# Patient Record
Sex: Female | Born: 1960 | Race: Black or African American | Hispanic: No | Smoking: Never smoker
Health system: Southern US, Community
[De-identification: ages and names within clinical notes are randomized; demographics above are authoritative.]

## PROBLEM LIST (undated history)

## (undated) DIAGNOSIS — M8580 Other specified disorders of bone density and structure, unspecified site: Secondary | ICD-10-CM

## (undated) DIAGNOSIS — K219 Gastro-esophageal reflux disease without esophagitis: Secondary | ICD-10-CM

## (undated) DIAGNOSIS — R251 Tremor, unspecified: Secondary | ICD-10-CM

## (undated) HISTORY — DX: Gastro-esophageal reflux disease without esophagitis: K21.9

## (undated) HISTORY — DX: Tremor, unspecified: R25.1

## (undated) HISTORY — PX: NO PAST SURGERIES: SHX2092

## (undated) HISTORY — DX: Other specified disorders of bone density and structure, unspecified site: M85.80

---

## 2016-04-22 DIAGNOSIS — Z713 Dietary counseling and surveillance: Secondary | ICD-10-CM | POA: Diagnosis not present

## 2016-06-30 ENCOUNTER — Other Ambulatory Visit: Payer: Self-pay | Admitting: Family Medicine

## 2016-06-30 ENCOUNTER — Ambulatory Visit
Admission: RE | Admit: 2016-06-30 | Discharge: 2016-06-30 | Disposition: A | Discharge status: Home / Self Care | Payer: BLUE CROSS/BLUE SHIELD | Source: Ambulatory Visit | Attending: Family Medicine | Admitting: Family Medicine

## 2016-06-30 DIAGNOSIS — M79604 Pain in right leg: Secondary | ICD-10-CM | POA: Diagnosis not present

## 2016-06-30 DIAGNOSIS — M79661 Pain in right lower leg: Secondary | ICD-10-CM | POA: Diagnosis not present

## 2016-06-30 DIAGNOSIS — Z23 Encounter for immunization: Secondary | ICD-10-CM | POA: Diagnosis not present

## 2016-07-16 DIAGNOSIS — R634 Abnormal weight loss: Secondary | ICD-10-CM | POA: Diagnosis not present

## 2016-07-16 DIAGNOSIS — K219 Gastro-esophageal reflux disease without esophagitis: Secondary | ICD-10-CM | POA: Diagnosis not present

## 2016-07-16 DIAGNOSIS — R251 Tremor, unspecified: Secondary | ICD-10-CM | POA: Diagnosis not present

## 2016-07-27 DIAGNOSIS — M6281 Muscle weakness (generalized): Secondary | ICD-10-CM | POA: Diagnosis not present

## 2016-07-27 DIAGNOSIS — R262 Difficulty in walking, not elsewhere classified: Secondary | ICD-10-CM | POA: Diagnosis not present

## 2016-07-27 DIAGNOSIS — M79604 Pain in right leg: Secondary | ICD-10-CM | POA: Diagnosis not present

## 2016-07-27 DIAGNOSIS — R293 Abnormal posture: Secondary | ICD-10-CM | POA: Diagnosis not present

## 2016-08-04 DIAGNOSIS — R251 Tremor, unspecified: Secondary | ICD-10-CM | POA: Diagnosis not present

## 2016-08-04 DIAGNOSIS — R0602 Shortness of breath: Secondary | ICD-10-CM | POA: Diagnosis not present

## 2016-08-04 DIAGNOSIS — K219 Gastro-esophageal reflux disease without esophagitis: Secondary | ICD-10-CM | POA: Diagnosis not present

## 2016-08-11 DIAGNOSIS — Z713 Dietary counseling and surveillance: Secondary | ICD-10-CM | POA: Diagnosis not present

## 2016-09-22 DIAGNOSIS — K219 Gastro-esophageal reflux disease without esophagitis: Secondary | ICD-10-CM | POA: Diagnosis not present

## 2016-09-22 DIAGNOSIS — R634 Abnormal weight loss: Secondary | ICD-10-CM | POA: Diagnosis not present

## 2016-09-23 DIAGNOSIS — R634 Abnormal weight loss: Secondary | ICD-10-CM | POA: Diagnosis not present

## 2016-09-23 DIAGNOSIS — K219 Gastro-esophageal reflux disease without esophagitis: Secondary | ICD-10-CM | POA: Diagnosis not present

## 2016-09-23 DIAGNOSIS — M8580 Other specified disorders of bone density and structure, unspecified site: Secondary | ICD-10-CM | POA: Diagnosis not present

## 2016-09-23 DIAGNOSIS — K3189 Other diseases of stomach and duodenum: Secondary | ICD-10-CM | POA: Diagnosis not present

## 2016-09-23 DIAGNOSIS — Z79899 Other long term (current) drug therapy: Secondary | ICD-10-CM | POA: Diagnosis not present

## 2016-09-23 DIAGNOSIS — R251 Tremor, unspecified: Secondary | ICD-10-CM | POA: Diagnosis not present

## 2016-09-23 DIAGNOSIS — K2970 Gastritis, unspecified, without bleeding: Secondary | ICD-10-CM | POA: Diagnosis not present

## 2016-09-23 DIAGNOSIS — K2900 Acute gastritis without bleeding: Secondary | ICD-10-CM | POA: Diagnosis not present

## 2016-10-02 ENCOUNTER — Ambulatory Visit: Payer: BLUE CROSS/BLUE SHIELD | Admitting: Neurology

## 2016-10-06 DIAGNOSIS — F411 Generalized anxiety disorder: Secondary | ICD-10-CM | POA: Diagnosis not present

## 2016-10-06 DIAGNOSIS — K219 Gastro-esophageal reflux disease without esophagitis: Secondary | ICD-10-CM | POA: Diagnosis not present

## 2016-10-06 DIAGNOSIS — R634 Abnormal weight loss: Secondary | ICD-10-CM | POA: Diagnosis not present

## 2016-10-14 ENCOUNTER — Encounter: Payer: Self-pay | Admitting: Neurology

## 2016-10-16 NOTE — Progress Notes (Signed)
Subjective:   Taylor Chapman was seen in consultation in the movement disorder clinic at the request of Sissy Hoff, MD.  The evaluation is for tremor.  I have reviewed her primary care records dated 07/16/2016.  The patient presented to her primary care physician that today with complaints of generalized shaking.  She stated that she had a large bowel movement one morning, which was followed by decreased oral intake.  Ever since that morning, she had several mornings of waking up feeling tense and shaky.  Her primary care physician prescribed her low-dose Ativan as needed and felt that anxiety could be a component.  She states that the ativan is helping.  She was taking it nightly but hasn't had it last 3 nights.  Tremor is worse in the AM but has been better as of late.  It is better as day goes on. When and if she has the tremor (better lately), it involves the hands bilaterally.  Tremor is most noticeable when hands are in use.   There is no family hx of tremor.   She had lab work tested that day.  Her TSH was normal at 0.84.    Affected by caffeine:  Doesn't know (doesn't drink caffeine) Affected by alcohol:  Doesn't know (doesn't drink EtOH) Affected by stress:  Yes.   Affected by fatigue:  perhaps Spills soup if on spoon:  No. Spills glass of liquid if full:  No. Affects ADL's (tying shoes, brushing teeth, etc):  No.  Current/Previously tried tremor medications: ativan  Current medications that may exacerbate tremor:  n/a  Other c/o is "jerking" as she is falling asleep or when she is asleep and will wake her out of sleep.  Outside reports reviewed: lab reports and office notes.  No Known Allergies  Outpatient Encounter Prescriptions as of 10/20/2016  Medication Sig  . LORazepam (ATIVAN) 0.5 MG tablet Take 0.5 mg by mouth every 8 (eight) hours.  . Multiple Vitamin (MULTIVITAMIN) tablet Take 1 tablet by mouth daily.  Marland Kitchen omeprazole (PRILOSEC) 20 MG capsule Take 20 mg by mouth  daily.   No facility-administered encounter medications on file as of 10/20/2016.     Past Medical History:  Diagnosis Date  . GERD (gastroesophageal reflux disease)   . Osteopenia   . Tremor     Past Surgical History:  Procedure Laterality Date  . NO PAST SURGERIES      Social History   Social History  . Marital status: Unknown    Spouse name: N/A  . Number of children: N/A  . Years of education: N/A   Occupational History  . Not on file.   Social History Main Topics  . Smoking status: Never Smoker  . Smokeless tobacco: Never Used  . Alcohol use No  . Drug use: No  . Sexual activity: Not on file   Other Topics Concern  . Not on file   Social History Narrative  . No narrative on file    Family Status  Relation Status  . Mother Alive  . Father Deceased  . Sister Alive  . Brother Alive    Review of Systems A complete 10 system ROS was obtained and was negative apart from what is mentioned.   Objective:   VITALS:   Vitals:   10/20/16 0836  BP: 92/60  Pulse: 74  SpO2: 99%  Weight: 127 lb (57.6 kg)  Height:  (1.702 m)   Gen:  Appears stated age and in NAD. HEENT:  Normocephalic, atraumatic. The mucous membranes are moist. The superficial temporal arteries are without ropiness or tenderness. Cardiovascular: Regular rate and rhythm. Lungs: Clear to auscultation bilaterally. Neck: There are no carotid bruits noted bilaterally.  NEUROLOGICAL:  Orientation:  The patient is alert and oriented x 3.  Recent and remote memory are intact.  Attention span and concentration are normal.  Able to name objects and repeat without trouble.  Fund of knowledge is appropriate Cranial nerves: There is good facial symmetry. The pupils are equal round and reactive to light bilaterally. Fundoscopic exam reveals clear disc margins bilaterally. Extraocular muscles are intact and visual fields are full to confrontational testing. Speech is fluent and clear. Soft palate  rises symmetrically and there is no tongue deviation. Hearing is intact to conversational tone. Tone: Tone is good throughout. Sensation: Sensation is intact to light touch and pinprick throughout (facial, trunk, extremities). Vibration is intact at the bilateral big toe. There is no extinction with double simultaneous stimulation. There is no sensory dermatomal level identified. Coordination:  The patient has no dysdiadichokinesia or dysmetria. Motor: Strength is 5/5 in the bilateral upper and lower extremities.  Shoulder shrug is equal bilaterally.  There is no pronator drift.  There are no fasciculations noted. DTR's: Deep tendon reflexes are 2/4 at the bilateral biceps, triceps, brachioradialis, patella and achilles.  Plantar responses are downgoing bilaterally. Gait and Station: The patient is able to ambulate without difficulty. The patient is able to heel toe walk without any difficulty. The patient is able to ambulate in a tandem fashion. The patient is able to stand in the Romberg position.   MOVEMENT EXAM: Tremor:  There is no tremor of the outstretched hands.  No tremor with intention.  There was no tremor when she was given a weight, either in the proximal or distal position.  There was no tremor when she was asked to pour water from one glass to another.  She has no tremor with Archimedes spirals.  She is able to write her name without any difficulty.  Labs: I work on 12/14/2016.  As above, TSH was 0.84.  Sodium was 142, potassium 4.1, chloride 107, CO2 25, BUN 10, creatinine 0.71, glucose was 116.  AST was 17, ALT 13.  White blood cells were 5.5, hemoglobin 12.6, hematocrit 37.6 and platelets were 231.  Her B12 was greater than 1500 and it was recommended by her primary care doctor that she decrease her supplemental intake.     Assessment/Plan:   1.  Tremor, by history  -I really did not see any tremor today.  She reports that she has some tremor when she awakens in the morning and  then it goes away throughout the day.  Sometimes this can be seen in patients who are hypoglycemic and I would recommend that, if indicated, this be further explored with her PCP.  She and I talked about enhanced physiologic tremor and anxiety-induced tremor, both of which are benign phenomenons and do not require any further treatment.  2.  Benign hypnic myoclonus  -explained to her the benign nature of this.  She was more worried about this than she was about tremor.    3.  f/u prn  CC:  Sissy Hoff, MD

## 2016-10-20 ENCOUNTER — Ambulatory Visit (INDEPENDENT_AMBULATORY_CARE_PROVIDER_SITE_OTHER): Payer: BLUE CROSS/BLUE SHIELD | Admitting: Neurology

## 2016-10-20 ENCOUNTER — Encounter: Payer: Self-pay | Admitting: Neurology

## 2016-10-20 VITALS — BP 92/60 | HR 74 | Ht 67.0 in | Wt 127.0 lb

## 2016-10-20 DIAGNOSIS — R251 Tremor, unspecified: Secondary | ICD-10-CM

## 2016-10-20 DIAGNOSIS — G253 Myoclonus: Secondary | ICD-10-CM | POA: Diagnosis not present

## 2016-11-03 DIAGNOSIS — K219 Gastro-esophageal reflux disease without esophagitis: Secondary | ICD-10-CM | POA: Diagnosis not present

## 2016-11-03 DIAGNOSIS — R1013 Epigastric pain: Secondary | ICD-10-CM | POA: Diagnosis not present

## 2016-11-03 DIAGNOSIS — R634 Abnormal weight loss: Secondary | ICD-10-CM | POA: Diagnosis not present

## 2016-12-14 DIAGNOSIS — Z1211 Encounter for screening for malignant neoplasm of colon: Secondary | ICD-10-CM | POA: Diagnosis not present

## 2016-12-14 DIAGNOSIS — K219 Gastro-esophageal reflux disease without esophagitis: Secondary | ICD-10-CM | POA: Diagnosis not present

## 2016-12-14 DIAGNOSIS — F411 Generalized anxiety disorder: Secondary | ICD-10-CM | POA: Diagnosis not present

## 2016-12-14 DIAGNOSIS — R7309 Other abnormal glucose: Secondary | ICD-10-CM | POA: Diagnosis not present

## 2016-12-14 DIAGNOSIS — M8588 Other specified disorders of bone density and structure, other site: Secondary | ICD-10-CM | POA: Diagnosis not present

## 2016-12-14 DIAGNOSIS — Z13220 Encounter for screening for lipoid disorders: Secondary | ICD-10-CM | POA: Diagnosis not present

## 2016-12-14 DIAGNOSIS — Z0000 Encounter for general adult medical examination without abnormal findings: Secondary | ICD-10-CM | POA: Diagnosis not present

## 2016-12-14 DIAGNOSIS — J309 Allergic rhinitis, unspecified: Secondary | ICD-10-CM | POA: Diagnosis not present

## 2017-07-16 DIAGNOSIS — Z713 Dietary counseling and surveillance: Secondary | ICD-10-CM | POA: Diagnosis not present

## 2017-08-05 DIAGNOSIS — J069 Acute upper respiratory infection, unspecified: Secondary | ICD-10-CM | POA: Diagnosis not present

## 2017-09-03 DIAGNOSIS — Z713 Dietary counseling and surveillance: Secondary | ICD-10-CM | POA: Diagnosis not present

## 2017-12-20 DIAGNOSIS — Z1211 Encounter for screening for malignant neoplasm of colon: Secondary | ICD-10-CM | POA: Diagnosis not present

## 2017-12-20 DIAGNOSIS — Z0000 Encounter for general adult medical examination without abnormal findings: Secondary | ICD-10-CM | POA: Diagnosis not present

## 2017-12-20 DIAGNOSIS — E559 Vitamin D deficiency, unspecified: Secondary | ICD-10-CM | POA: Diagnosis not present

## 2018-02-01 DIAGNOSIS — M8588 Other specified disorders of bone density and structure, other site: Secondary | ICD-10-CM | POA: Diagnosis not present

## 2018-02-01 DIAGNOSIS — M810 Age-related osteoporosis without current pathological fracture: Secondary | ICD-10-CM | POA: Diagnosis not present

## 2018-02-08 DIAGNOSIS — M810 Age-related osteoporosis without current pathological fracture: Secondary | ICD-10-CM | POA: Diagnosis not present

## 2018-06-27 DIAGNOSIS — Z23 Encounter for immunization: Secondary | ICD-10-CM | POA: Diagnosis not present

## 2018-12-22 DIAGNOSIS — D649 Anemia, unspecified: Secondary | ICD-10-CM | POA: Diagnosis not present

## 2018-12-22 DIAGNOSIS — R7309 Other abnormal glucose: Secondary | ICD-10-CM | POA: Diagnosis not present

## 2018-12-22 DIAGNOSIS — Z1211 Encounter for screening for malignant neoplasm of colon: Secondary | ICD-10-CM | POA: Diagnosis not present

## 2018-12-22 DIAGNOSIS — E559 Vitamin D deficiency, unspecified: Secondary | ICD-10-CM | POA: Diagnosis not present

## 2018-12-22 DIAGNOSIS — Z0000 Encounter for general adult medical examination without abnormal findings: Secondary | ICD-10-CM | POA: Diagnosis not present

## 2018-12-22 DIAGNOSIS — Z136 Encounter for screening for cardiovascular disorders: Secondary | ICD-10-CM | POA: Diagnosis not present

## 2019-03-09 DIAGNOSIS — M546 Pain in thoracic spine: Secondary | ICD-10-CM | POA: Diagnosis not present

## 2019-04-20 DIAGNOSIS — L719 Rosacea, unspecified: Secondary | ICD-10-CM | POA: Diagnosis not present

## 2019-04-20 DIAGNOSIS — D485 Neoplasm of uncertain behavior of skin: Secondary | ICD-10-CM | POA: Diagnosis not present

## 2019-06-21 DIAGNOSIS — L719 Rosacea, unspecified: Secondary | ICD-10-CM | POA: Diagnosis not present

## 2019-10-09 DIAGNOSIS — H524 Presbyopia: Secondary | ICD-10-CM | POA: Diagnosis not present

## 2020-02-06 DIAGNOSIS — Z13220 Encounter for screening for lipoid disorders: Secondary | ICD-10-CM | POA: Diagnosis not present

## 2020-02-06 DIAGNOSIS — D649 Anemia, unspecified: Secondary | ICD-10-CM | POA: Diagnosis not present

## 2020-02-06 DIAGNOSIS — Z0000 Encounter for general adult medical examination without abnormal findings: Secondary | ICD-10-CM | POA: Diagnosis not present

## 2020-02-06 DIAGNOSIS — M810 Age-related osteoporosis without current pathological fracture: Secondary | ICD-10-CM | POA: Diagnosis not present

## 2020-02-06 DIAGNOSIS — K219 Gastro-esophageal reflux disease without esophagitis: Secondary | ICD-10-CM | POA: Diagnosis not present

## 2020-02-06 DIAGNOSIS — E559 Vitamin D deficiency, unspecified: Secondary | ICD-10-CM | POA: Diagnosis not present

## 2020-02-06 DIAGNOSIS — R7303 Prediabetes: Secondary | ICD-10-CM | POA: Diagnosis not present

## 2020-02-16 ENCOUNTER — Other Ambulatory Visit: Payer: Self-pay | Admitting: Family Medicine

## 2020-02-16 DIAGNOSIS — M810 Age-related osteoporosis without current pathological fracture: Secondary | ICD-10-CM

## 2020-02-21 ENCOUNTER — Other Ambulatory Visit: Payer: BLUE CROSS/BLUE SHIELD

## 2020-03-11 DIAGNOSIS — Z7189 Other specified counseling: Secondary | ICD-10-CM | POA: Diagnosis not present

## 2020-03-11 DIAGNOSIS — K219 Gastro-esophageal reflux disease without esophagitis: Secondary | ICD-10-CM | POA: Diagnosis not present

## 2020-03-11 DIAGNOSIS — G4700 Insomnia, unspecified: Secondary | ICD-10-CM | POA: Diagnosis not present

## 2020-03-11 DIAGNOSIS — F439 Reaction to severe stress, unspecified: Secondary | ICD-10-CM | POA: Diagnosis not present

## 2020-04-25 ENCOUNTER — Other Ambulatory Visit: Payer: Self-pay | Admitting: Family Medicine

## 2020-04-25 DIAGNOSIS — Z1231 Encounter for screening mammogram for malignant neoplasm of breast: Secondary | ICD-10-CM

## 2020-05-07 ENCOUNTER — Ambulatory Visit
Admission: RE | Admit: 2020-05-07 | Discharge: 2020-05-07 | Disposition: A | Discharge status: Home / Self Care | Payer: BLUE CROSS/BLUE SHIELD | Source: Ambulatory Visit | Attending: Family Medicine | Admitting: Family Medicine

## 2020-05-07 ENCOUNTER — Other Ambulatory Visit: Payer: Self-pay

## 2020-05-07 ENCOUNTER — Ambulatory Visit
Admission: RE | Admit: 2020-05-07 | Discharge: 2020-05-07 | Disposition: A | Discharge status: Home / Self Care | Payer: BC Managed Care – PPO | Source: Ambulatory Visit | Attending: Family Medicine | Admitting: Family Medicine

## 2020-05-07 DIAGNOSIS — Z1231 Encounter for screening mammogram for malignant neoplasm of breast: Secondary | ICD-10-CM | POA: Diagnosis not present

## 2020-05-07 DIAGNOSIS — M8589 Other specified disorders of bone density and structure, multiple sites: Secondary | ICD-10-CM | POA: Diagnosis not present

## 2020-05-07 DIAGNOSIS — Z780 Asymptomatic menopausal state: Secondary | ICD-10-CM | POA: Diagnosis not present

## 2020-05-07 DIAGNOSIS — M810 Age-related osteoporosis without current pathological fracture: Secondary | ICD-10-CM

## 2020-06-04 DIAGNOSIS — Z1152 Encounter for screening for COVID-19: Secondary | ICD-10-CM | POA: Diagnosis not present

## 2020-06-04 DIAGNOSIS — J3089 Other allergic rhinitis: Secondary | ICD-10-CM | POA: Diagnosis not present

## 2021-02-06 DIAGNOSIS — M810 Age-related osteoporosis without current pathological fracture: Secondary | ICD-10-CM | POA: Diagnosis not present

## 2021-02-06 DIAGNOSIS — Z23 Encounter for immunization: Secondary | ICD-10-CM | POA: Diagnosis not present

## 2021-02-06 DIAGNOSIS — R7303 Prediabetes: Secondary | ICD-10-CM | POA: Diagnosis not present

## 2021-02-06 DIAGNOSIS — M25511 Pain in right shoulder: Secondary | ICD-10-CM | POA: Diagnosis not present

## 2021-02-06 DIAGNOSIS — E559 Vitamin D deficiency, unspecified: Secondary | ICD-10-CM | POA: Diagnosis not present

## 2021-02-06 DIAGNOSIS — Z0000 Encounter for general adult medical examination without abnormal findings: Secondary | ICD-10-CM | POA: Diagnosis not present

## 2021-02-06 DIAGNOSIS — D72819 Decreased white blood cell count, unspecified: Secondary | ICD-10-CM | POA: Diagnosis not present

## 2021-02-06 DIAGNOSIS — K219 Gastro-esophageal reflux disease without esophagitis: Secondary | ICD-10-CM | POA: Diagnosis not present

## 2021-03-01 IMAGING — MG DIGITAL SCREENING BILAT W/ TOMO W/ CAD
8 series · 9 of 24 positions shown · non-contrast
Comparison: Previous exam(s).

CLINICAL DATA: Screening.

EXAM:
DIGITAL SCREENING BILATERAL MAMMOGRAM WITH TOMO AND CAD

[R CC synth-2D]
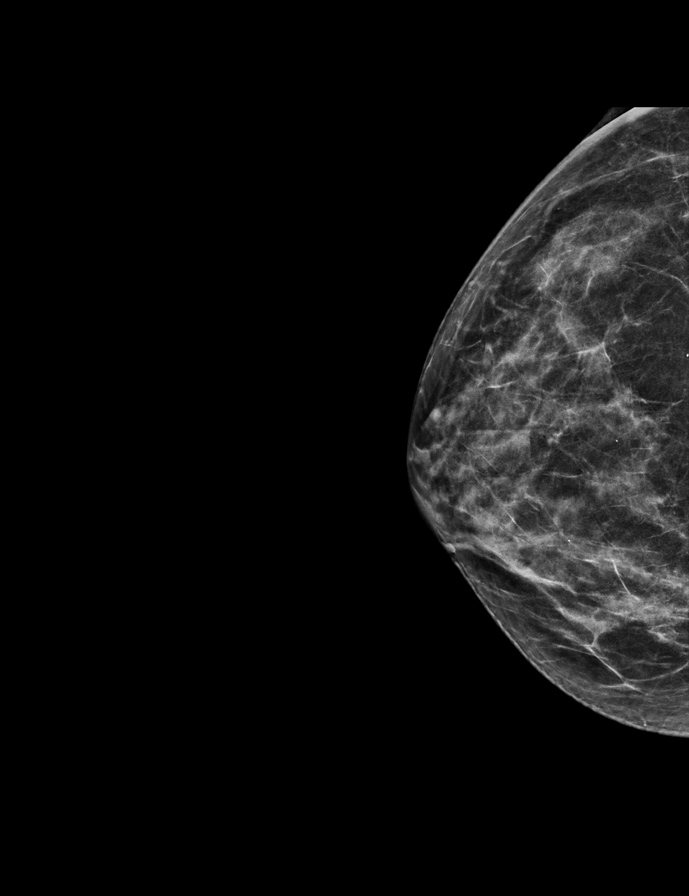

[R MLO synth-2D]
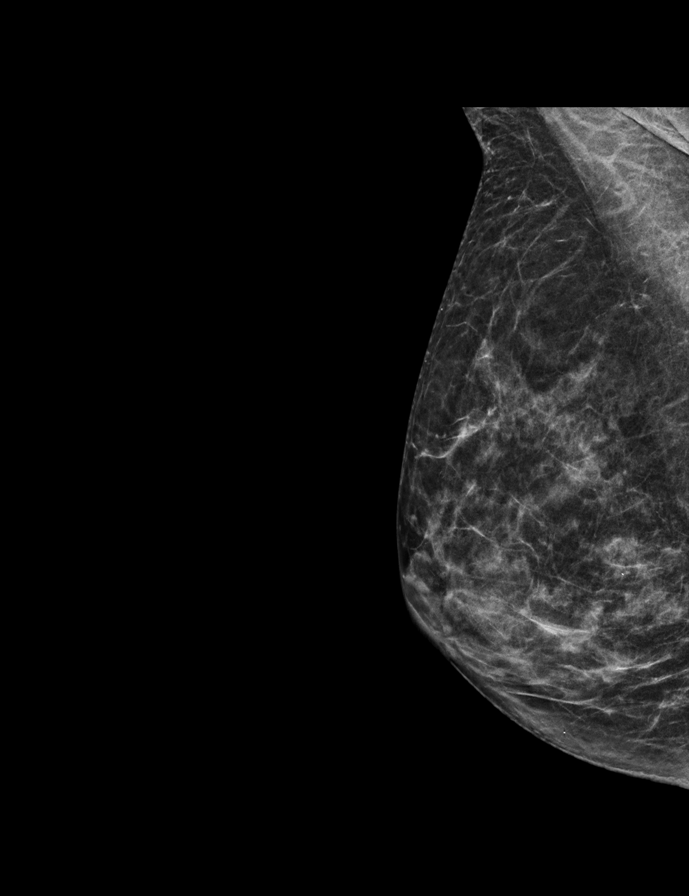

[L CC synth-2D]
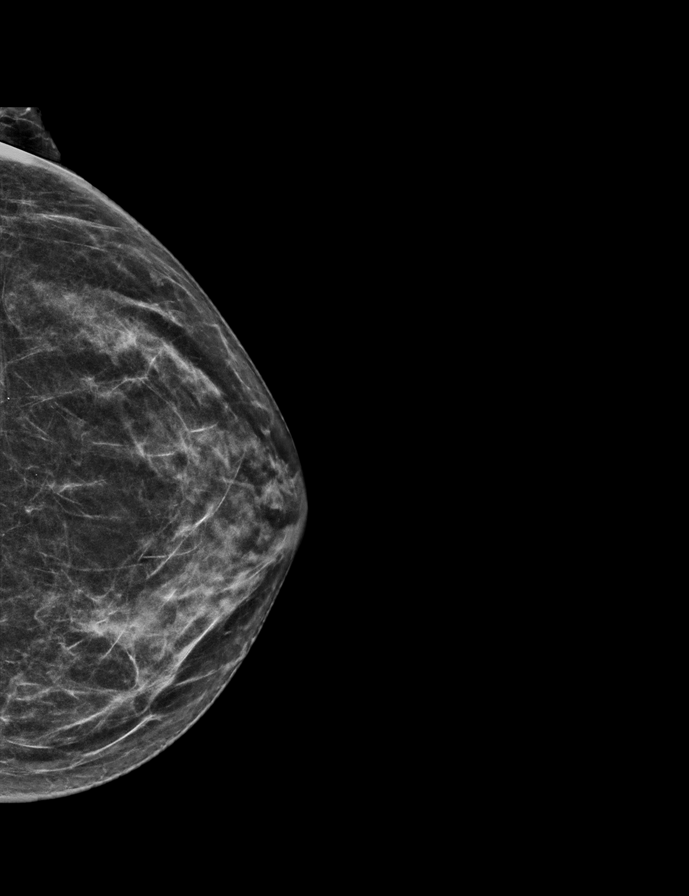

[L MLO synth-2D]
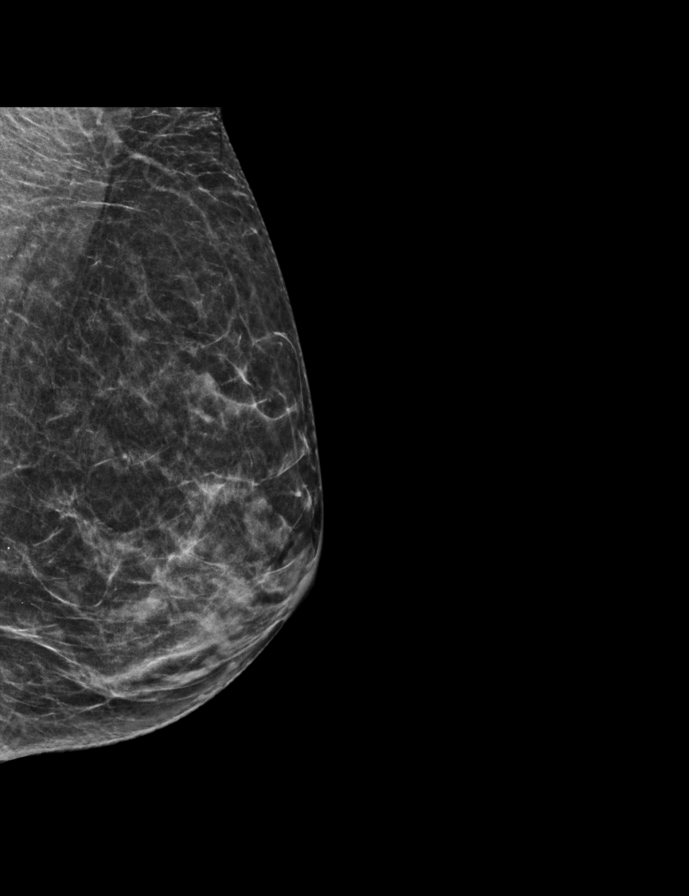

[L MLO tomo · 2 of 49 frames shown]
[frame 16/49]
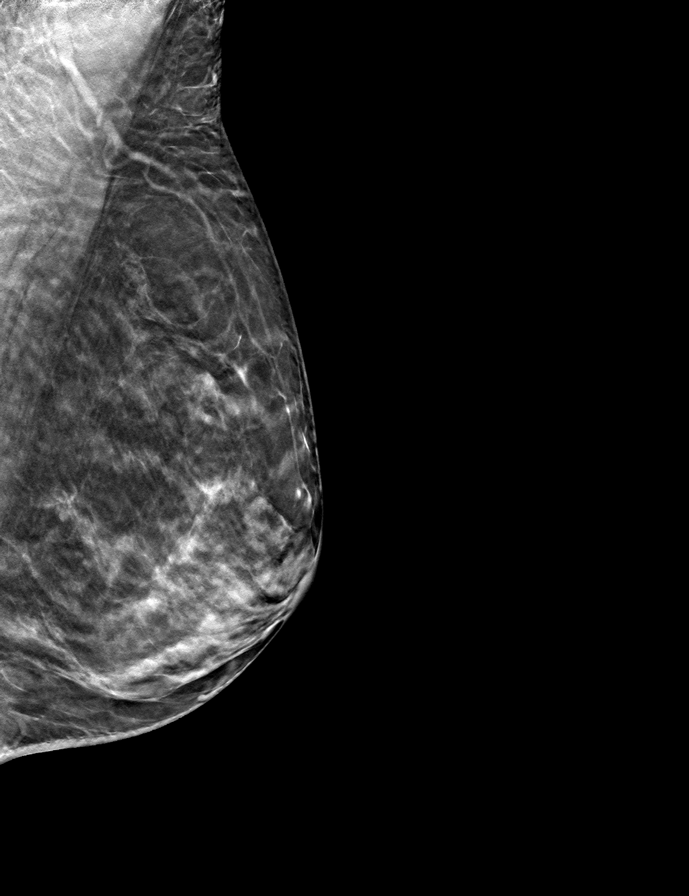
[frame 25/49]
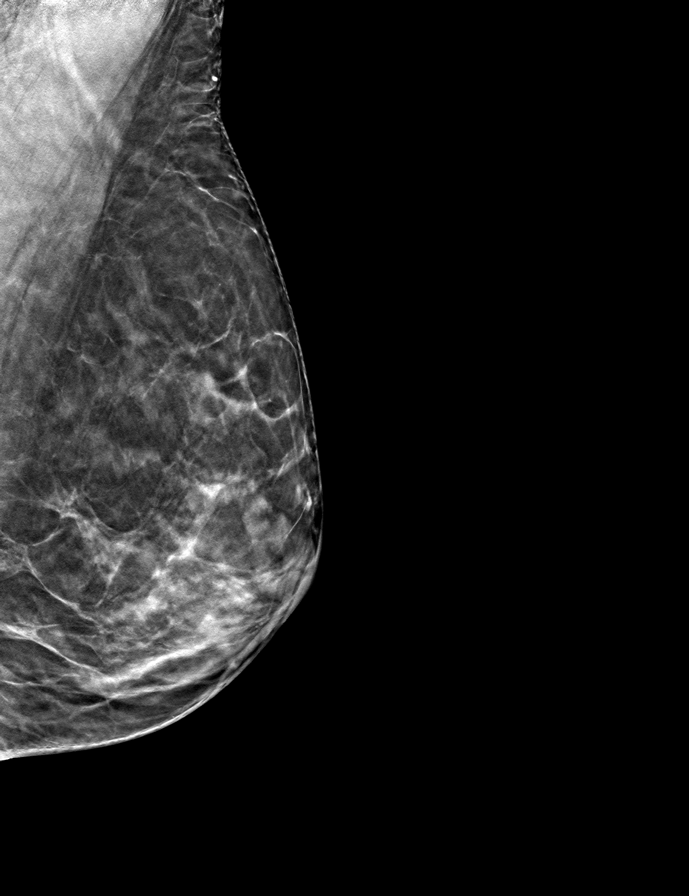

[R MLO tomo · tomo slice 25/49.0]
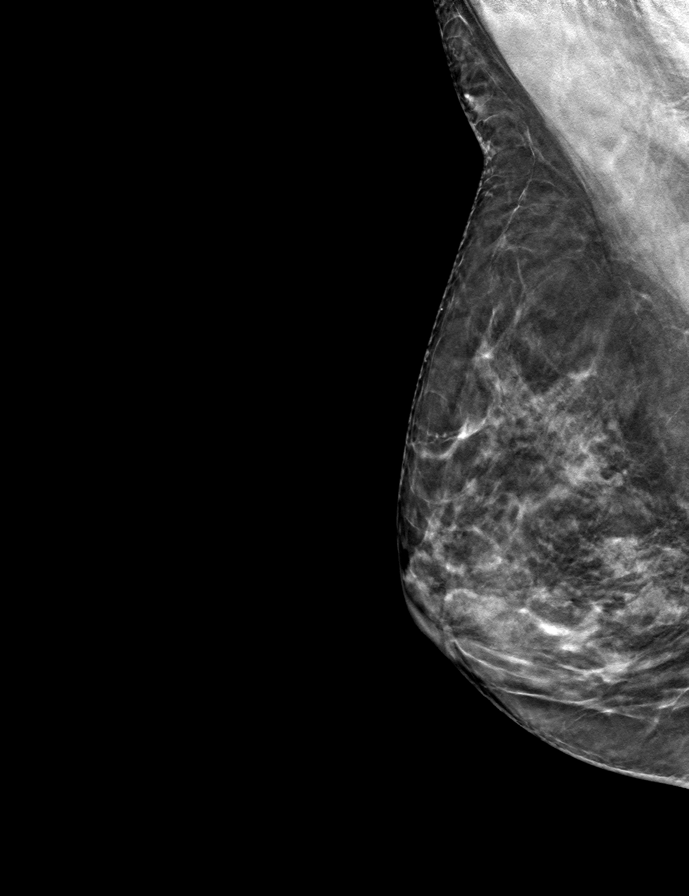

[L CC tomo · tomo slice 26/51.0]
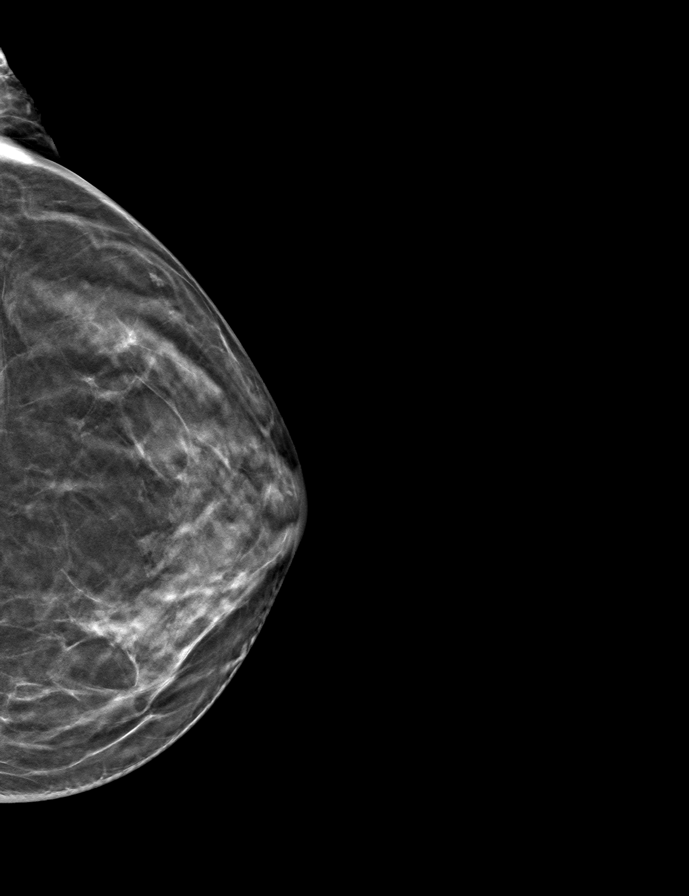

[R CC tomo · tomo slice 27/52.0]
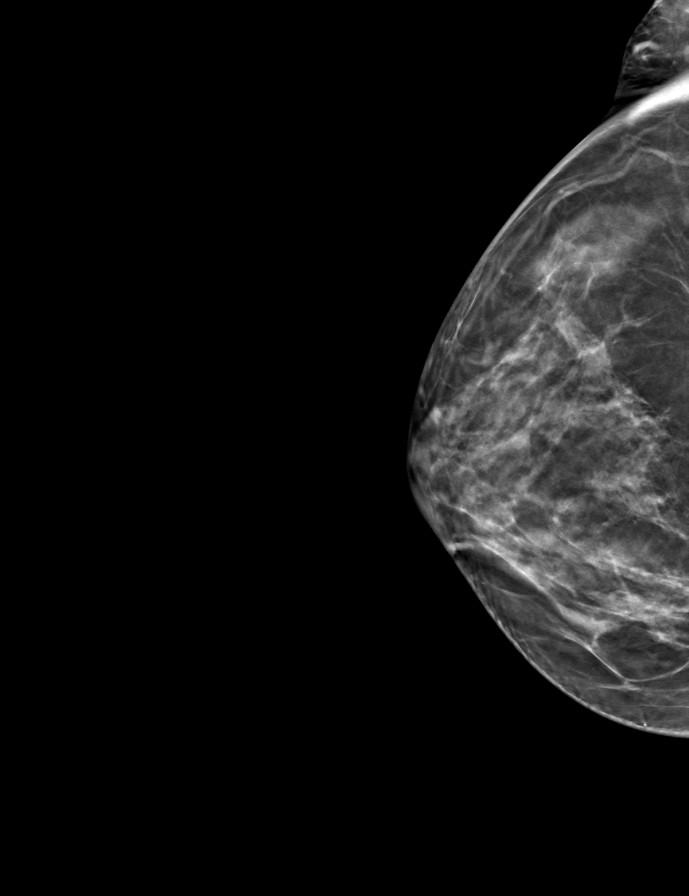

[9 of 24 positions shown; findings below may reference images not displayed]

ACR Breast Density Category c: The breast tissue is heterogeneously
dense, which may obscure small masses.
FINDINGS: There are no findings suspicious for malignancy. Images were
processed with CAD.
IMPRESSION: No mammographic evidence of malignancy. A result letter of this
screening mammogram will be mailed directly to the patient.

RECOMMENDATION:
Screening mammogram in one year. (Code:FT-U-LHB)

BI-RADS CATEGORY  1: Negative.

## 2021-04-11 DIAGNOSIS — Z23 Encounter for immunization: Secondary | ICD-10-CM | POA: Diagnosis not present

## 2021-06-12 DIAGNOSIS — H9313 Tinnitus, bilateral: Secondary | ICD-10-CM | POA: Diagnosis not present

## 2021-06-12 DIAGNOSIS — M25511 Pain in right shoulder: Secondary | ICD-10-CM | POA: Diagnosis not present

## 2021-06-18 DIAGNOSIS — M25511 Pain in right shoulder: Secondary | ICD-10-CM | POA: Diagnosis not present

## 2021-09-17 DIAGNOSIS — M9901 Segmental and somatic dysfunction of cervical region: Secondary | ICD-10-CM | POA: Diagnosis not present

## 2021-09-17 DIAGNOSIS — M9902 Segmental and somatic dysfunction of thoracic region: Secondary | ICD-10-CM | POA: Diagnosis not present

## 2021-09-17 DIAGNOSIS — M542 Cervicalgia: Secondary | ICD-10-CM | POA: Diagnosis not present

## 2021-09-17 DIAGNOSIS — M9904 Segmental and somatic dysfunction of sacral region: Secondary | ICD-10-CM | POA: Diagnosis not present

## 2021-09-17 DIAGNOSIS — M546 Pain in thoracic spine: Secondary | ICD-10-CM | POA: Diagnosis not present

## 2021-09-17 DIAGNOSIS — M62830 Muscle spasm of back: Secondary | ICD-10-CM | POA: Diagnosis not present

## 2021-09-17 DIAGNOSIS — M9905 Segmental and somatic dysfunction of pelvic region: Secondary | ICD-10-CM | POA: Diagnosis not present

## 2021-09-23 DIAGNOSIS — M9902 Segmental and somatic dysfunction of thoracic region: Secondary | ICD-10-CM | POA: Diagnosis not present

## 2021-09-23 DIAGNOSIS — M9904 Segmental and somatic dysfunction of sacral region: Secondary | ICD-10-CM | POA: Diagnosis not present

## 2021-09-23 DIAGNOSIS — M542 Cervicalgia: Secondary | ICD-10-CM | POA: Diagnosis not present

## 2021-09-23 DIAGNOSIS — M62830 Muscle spasm of back: Secondary | ICD-10-CM | POA: Diagnosis not present

## 2021-09-23 DIAGNOSIS — M9901 Segmental and somatic dysfunction of cervical region: Secondary | ICD-10-CM | POA: Diagnosis not present

## 2021-09-23 DIAGNOSIS — M9905 Segmental and somatic dysfunction of pelvic region: Secondary | ICD-10-CM | POA: Diagnosis not present

## 2021-09-29 DIAGNOSIS — M9904 Segmental and somatic dysfunction of sacral region: Secondary | ICD-10-CM | POA: Diagnosis not present

## 2021-09-29 DIAGNOSIS — M62830 Muscle spasm of back: Secondary | ICD-10-CM | POA: Diagnosis not present

## 2021-09-29 DIAGNOSIS — M546 Pain in thoracic spine: Secondary | ICD-10-CM | POA: Diagnosis not present

## 2021-09-29 DIAGNOSIS — M9905 Segmental and somatic dysfunction of pelvic region: Secondary | ICD-10-CM | POA: Diagnosis not present

## 2021-09-29 DIAGNOSIS — M9902 Segmental and somatic dysfunction of thoracic region: Secondary | ICD-10-CM | POA: Diagnosis not present

## 2021-09-29 DIAGNOSIS — M9901 Segmental and somatic dysfunction of cervical region: Secondary | ICD-10-CM | POA: Diagnosis not present

## 2021-10-08 DIAGNOSIS — M9905 Segmental and somatic dysfunction of pelvic region: Secondary | ICD-10-CM | POA: Diagnosis not present

## 2021-10-08 DIAGNOSIS — M9902 Segmental and somatic dysfunction of thoracic region: Secondary | ICD-10-CM | POA: Diagnosis not present

## 2021-10-08 DIAGNOSIS — M9901 Segmental and somatic dysfunction of cervical region: Secondary | ICD-10-CM | POA: Diagnosis not present

## 2021-10-08 DIAGNOSIS — M9904 Segmental and somatic dysfunction of sacral region: Secondary | ICD-10-CM | POA: Diagnosis not present

## 2021-10-08 DIAGNOSIS — M62830 Muscle spasm of back: Secondary | ICD-10-CM | POA: Diagnosis not present

## 2021-10-08 DIAGNOSIS — M546 Pain in thoracic spine: Secondary | ICD-10-CM | POA: Diagnosis not present

## 2021-10-10 DIAGNOSIS — M9904 Segmental and somatic dysfunction of sacral region: Secondary | ICD-10-CM | POA: Diagnosis not present

## 2021-10-10 DIAGNOSIS — M9901 Segmental and somatic dysfunction of cervical region: Secondary | ICD-10-CM | POA: Diagnosis not present

## 2021-10-10 DIAGNOSIS — M546 Pain in thoracic spine: Secondary | ICD-10-CM | POA: Diagnosis not present

## 2021-10-10 DIAGNOSIS — M62830 Muscle spasm of back: Secondary | ICD-10-CM | POA: Diagnosis not present

## 2021-10-10 DIAGNOSIS — M9905 Segmental and somatic dysfunction of pelvic region: Secondary | ICD-10-CM | POA: Diagnosis not present

## 2021-10-10 DIAGNOSIS — M9902 Segmental and somatic dysfunction of thoracic region: Secondary | ICD-10-CM | POA: Diagnosis not present

## 2021-10-15 DIAGNOSIS — M9905 Segmental and somatic dysfunction of pelvic region: Secondary | ICD-10-CM | POA: Diagnosis not present

## 2021-10-15 DIAGNOSIS — M9902 Segmental and somatic dysfunction of thoracic region: Secondary | ICD-10-CM | POA: Diagnosis not present

## 2021-10-15 DIAGNOSIS — M62830 Muscle spasm of back: Secondary | ICD-10-CM | POA: Diagnosis not present

## 2021-10-15 DIAGNOSIS — M546 Pain in thoracic spine: Secondary | ICD-10-CM | POA: Diagnosis not present

## 2021-10-15 DIAGNOSIS — M9901 Segmental and somatic dysfunction of cervical region: Secondary | ICD-10-CM | POA: Diagnosis not present

## 2021-10-15 DIAGNOSIS — M9904 Segmental and somatic dysfunction of sacral region: Secondary | ICD-10-CM | POA: Diagnosis not present

## 2021-10-22 DIAGNOSIS — M9902 Segmental and somatic dysfunction of thoracic region: Secondary | ICD-10-CM | POA: Diagnosis not present

## 2021-10-22 DIAGNOSIS — M546 Pain in thoracic spine: Secondary | ICD-10-CM | POA: Diagnosis not present

## 2021-10-22 DIAGNOSIS — M9905 Segmental and somatic dysfunction of pelvic region: Secondary | ICD-10-CM | POA: Diagnosis not present

## 2021-10-22 DIAGNOSIS — M62830 Muscle spasm of back: Secondary | ICD-10-CM | POA: Diagnosis not present

## 2021-10-22 DIAGNOSIS — M9904 Segmental and somatic dysfunction of sacral region: Secondary | ICD-10-CM | POA: Diagnosis not present

## 2021-10-22 DIAGNOSIS — M9901 Segmental and somatic dysfunction of cervical region: Secondary | ICD-10-CM | POA: Diagnosis not present

## 2021-11-05 DIAGNOSIS — M9901 Segmental and somatic dysfunction of cervical region: Secondary | ICD-10-CM | POA: Diagnosis not present

## 2021-11-05 DIAGNOSIS — M546 Pain in thoracic spine: Secondary | ICD-10-CM | POA: Diagnosis not present

## 2021-11-05 DIAGNOSIS — M9905 Segmental and somatic dysfunction of pelvic region: Secondary | ICD-10-CM | POA: Diagnosis not present

## 2021-11-05 DIAGNOSIS — M9902 Segmental and somatic dysfunction of thoracic region: Secondary | ICD-10-CM | POA: Diagnosis not present

## 2021-11-05 DIAGNOSIS — M9904 Segmental and somatic dysfunction of sacral region: Secondary | ICD-10-CM | POA: Diagnosis not present

## 2021-11-05 DIAGNOSIS — M62830 Muscle spasm of back: Secondary | ICD-10-CM | POA: Diagnosis not present

## 2021-11-20 DIAGNOSIS — R634 Abnormal weight loss: Secondary | ICD-10-CM | POA: Diagnosis not present

## 2021-11-20 DIAGNOSIS — R7303 Prediabetes: Secondary | ICD-10-CM | POA: Diagnosis not present

## 2021-11-20 DIAGNOSIS — K219 Gastro-esophageal reflux disease without esophagitis: Secondary | ICD-10-CM | POA: Diagnosis not present

## 2021-11-26 DIAGNOSIS — M9901 Segmental and somatic dysfunction of cervical region: Secondary | ICD-10-CM | POA: Diagnosis not present

## 2021-11-26 DIAGNOSIS — M9905 Segmental and somatic dysfunction of pelvic region: Secondary | ICD-10-CM | POA: Diagnosis not present

## 2021-11-26 DIAGNOSIS — M62830 Muscle spasm of back: Secondary | ICD-10-CM | POA: Diagnosis not present

## 2021-11-26 DIAGNOSIS — M546 Pain in thoracic spine: Secondary | ICD-10-CM | POA: Diagnosis not present

## 2021-11-26 DIAGNOSIS — M9904 Segmental and somatic dysfunction of sacral region: Secondary | ICD-10-CM | POA: Diagnosis not present

## 2021-11-26 DIAGNOSIS — M9902 Segmental and somatic dysfunction of thoracic region: Secondary | ICD-10-CM | POA: Diagnosis not present

## 2022-03-16 DIAGNOSIS — L820 Inflamed seborrheic keratosis: Secondary | ICD-10-CM | POA: Diagnosis not present

## 2022-03-16 DIAGNOSIS — L719 Rosacea, unspecified: Secondary | ICD-10-CM | POA: Diagnosis not present

## 2022-03-26 DIAGNOSIS — Z0000 Encounter for general adult medical examination without abnormal findings: Secondary | ICD-10-CM | POA: Diagnosis not present

## 2022-03-26 DIAGNOSIS — D72819 Decreased white blood cell count, unspecified: Secondary | ICD-10-CM | POA: Diagnosis not present

## 2022-03-26 DIAGNOSIS — M810 Age-related osteoporosis without current pathological fracture: Secondary | ICD-10-CM | POA: Diagnosis not present

## 2022-03-26 DIAGNOSIS — E559 Vitamin D deficiency, unspecified: Secondary | ICD-10-CM | POA: Diagnosis not present

## 2022-03-26 DIAGNOSIS — Z23 Encounter for immunization: Secondary | ICD-10-CM | POA: Diagnosis not present

## 2022-03-26 DIAGNOSIS — Z13220 Encounter for screening for lipoid disorders: Secondary | ICD-10-CM | POA: Diagnosis not present

## 2022-03-26 DIAGNOSIS — R7303 Prediabetes: Secondary | ICD-10-CM | POA: Diagnosis not present

## 2022-03-26 DIAGNOSIS — R5383 Other fatigue: Secondary | ICD-10-CM | POA: Diagnosis not present

## 2022-03-26 DIAGNOSIS — K219 Gastro-esophageal reflux disease without esophagitis: Secondary | ICD-10-CM | POA: Diagnosis not present

## 2022-03-27 ENCOUNTER — Other Ambulatory Visit: Payer: Self-pay | Admitting: Family Medicine

## 2022-03-27 DIAGNOSIS — M810 Age-related osteoporosis without current pathological fracture: Secondary | ICD-10-CM

## 2022-07-09 DIAGNOSIS — L821 Other seborrheic keratosis: Secondary | ICD-10-CM | POA: Diagnosis not present

## 2022-07-09 DIAGNOSIS — L209 Atopic dermatitis, unspecified: Secondary | ICD-10-CM | POA: Diagnosis not present

## 2022-07-09 DIAGNOSIS — L810 Postinflammatory hyperpigmentation: Secondary | ICD-10-CM | POA: Diagnosis not present

## 2022-07-09 DIAGNOSIS — L299 Pruritus, unspecified: Secondary | ICD-10-CM | POA: Diagnosis not present

## 2022-10-09 DIAGNOSIS — H5203 Hypermetropia, bilateral: Secondary | ICD-10-CM | POA: Diagnosis not present

## 2022-12-10 ENCOUNTER — Inpatient Hospital Stay: Admission: RE | Admit: 2022-12-10 | Payer: BC Managed Care – PPO | Source: Ambulatory Visit

## 2023-06-03 DIAGNOSIS — Z23 Encounter for immunization: Secondary | ICD-10-CM | POA: Diagnosis not present

## 2023-09-28 ENCOUNTER — Other Ambulatory Visit: Payer: Self-pay | Admitting: Family Medicine

## 2023-09-28 DIAGNOSIS — Z0000 Encounter for general adult medical examination without abnormal findings: Secondary | ICD-10-CM | POA: Diagnosis not present

## 2023-09-28 DIAGNOSIS — E559 Vitamin D deficiency, unspecified: Secondary | ICD-10-CM | POA: Diagnosis not present

## 2023-09-28 DIAGNOSIS — D649 Anemia, unspecified: Secondary | ICD-10-CM | POA: Diagnosis not present

## 2023-09-28 DIAGNOSIS — M810 Age-related osteoporosis without current pathological fracture: Secondary | ICD-10-CM | POA: Diagnosis not present

## 2023-09-28 DIAGNOSIS — K219 Gastro-esophageal reflux disease without esophagitis: Secondary | ICD-10-CM | POA: Diagnosis not present

## 2023-09-28 DIAGNOSIS — R7303 Prediabetes: Secondary | ICD-10-CM | POA: Diagnosis not present

## 2023-09-28 DIAGNOSIS — Z13220 Encounter for screening for lipoid disorders: Secondary | ICD-10-CM | POA: Diagnosis not present

## 2023-09-28 DIAGNOSIS — H9313 Tinnitus, bilateral: Secondary | ICD-10-CM | POA: Diagnosis not present

## 2023-10-21 DIAGNOSIS — Z1211 Encounter for screening for malignant neoplasm of colon: Secondary | ICD-10-CM | POA: Diagnosis not present

## 2023-12-15 ENCOUNTER — Institutional Professional Consult (permissible substitution) (INDEPENDENT_AMBULATORY_CARE_PROVIDER_SITE_OTHER): Admitting: Otolaryngology

## 2023-12-15 ENCOUNTER — Ambulatory Visit (INDEPENDENT_AMBULATORY_CARE_PROVIDER_SITE_OTHER): Admitting: Audiology

## 2024-02-29 DIAGNOSIS — B029 Zoster without complications: Secondary | ICD-10-CM | POA: Diagnosis not present

## 2024-02-29 DIAGNOSIS — R21 Rash and other nonspecific skin eruption: Secondary | ICD-10-CM | POA: Diagnosis not present

## 2024-04-06 ENCOUNTER — Encounter (INDEPENDENT_AMBULATORY_CARE_PROVIDER_SITE_OTHER): Payer: Self-pay | Admitting: Otolaryngology

## 2024-04-06 ENCOUNTER — Ambulatory Visit (INDEPENDENT_AMBULATORY_CARE_PROVIDER_SITE_OTHER): Admitting: Audiology

## 2024-04-06 ENCOUNTER — Ambulatory Visit (INDEPENDENT_AMBULATORY_CARE_PROVIDER_SITE_OTHER): Admitting: Otolaryngology

## 2024-04-06 VITALS — BP 122/80 | HR 69 | Ht 67.0 in

## 2024-04-06 DIAGNOSIS — H903 Sensorineural hearing loss, bilateral: Secondary | ICD-10-CM

## 2024-04-06 DIAGNOSIS — R0982 Postnasal drip: Secondary | ICD-10-CM

## 2024-04-06 DIAGNOSIS — H9313 Tinnitus, bilateral: Secondary | ICD-10-CM | POA: Diagnosis not present

## 2024-04-06 DIAGNOSIS — H6122 Impacted cerumen, left ear: Secondary | ICD-10-CM | POA: Diagnosis not present

## 2024-04-06 NOTE — Progress Notes (Signed)
  [address] [place], [place] [address] [phone]  Audiological Evaluation    Name: Taylor Chapman     DOB:   [birth year]      MRN:   [id number]                                                                                     Service Date: 04/06/2024     Accompanied by: unaccompanied   Patient comes today after Dr. Tobie, ENT sent a referral for a hearing evaluation due to concerns with tinnitus.   Symptoms Yes Details  Hearing loss  []    Tinnitus  [x]  Both ears for a year, noticed more when it is quiet  Ear pain/ infections/pressure  []    Balance problems  []    Noise exposure history  []    Previous ear surgeries  []    Family history of hearing loss  []    Amplification  []    Other  []      Otoscopy: Right ear: Clear external ear canal and notable landmarks visualized on the tympanic membrane. Left ear:  Non-occluding cerumen,limited eardrum visibility.  Tympanometry: Right ear: Type A- Normal external ear canal volume with normal middle ear pressure and tympanic membrane compliance. Left ear: Type A- Normal external ear canal volume with normal middle ear pressure and tympanic membrane compliance.    Pure tone Audiometry: Right ear- Normal hearing from [phone] Hz, then mild to moderate sensorineural hearing loss from 1500 Hz - 8000 Hz. Left ear-  Normal hearing from [phone], then mild to moderate sensorineural hearing loss from 2000 Hz - 8000 Hz.  Speech Audiometry: Right ear- Speech Reception Threshold (SRT) was obtained at 20 dBHL. Left ear-Speech Reception Threshold (SRT) was obtained at 15 dBHL.   Word Recognition Score Tested using NU-6 (recorded) Right ear: 90% was obtained at a presentation level of 60 dBHL with contralateral masking which is deemed as  excellent. Left ear: 100% was obtained at a presentation level of 60 dBHL with contralateral masking which is deemed as  excellent.   The hearing test results were completed under  headphones and results are deemed to be of good reliability. Test technique:  conventional    Impression: There is not a significant difference in pure-tone thresholds between ears., There is not a significant difference in the word recognition score in between ears.    Recommendations: Follow up with ENT as scheduled for today.Return for a hearing evaluation if concerns with hearing changes arise or per MD recommendation.Consider various tinnitus strategies, including the use of a sound generator, hearing aids, and/or tinnitus retraining therapy. Consider a communication needs assessment after medical clearance for hearing aids is obtained.   Taylor Chapman Taylor Chapman, AUD

## 2024-04-06 NOTE — Patient Instructions (Signed)
 Lipoflavonoid -- name of medication for ringing Use a masking device (can buy on guam) Can use neti pot or Aureliano Med sinus rinse for drainage.

## 2024-04-06 NOTE — Progress Notes (Signed)
 Dear Dr. Seabron, Here is my assessment for our mutual patient, Taylor Chapman. Thank you for allowing me the opportunity to care for your patient. Please do not hesitate to contact me should you have any other questions. Sincerely, Dr. Eldora Blanch  Otolaryngology Clinic Note Referring provider: Dr. Seabron HPI:  Initial visit (03/2024) Discussed the use of AI scribe software for clinical note transcription with the patient, who gave verbal consent to proceed.  History of Present Illness Taylor Chapman is a 63 year old female who presents with tinnitus.   She experiences a high-pitched buzzing  sound in ears bilaterally (difficult to describe, but not pulsatile). The noise is intermittent and primarily noticeable at night or in quiet environments. This has been ongoing for about a year without any specific triggering event, such as loud noise exposure or trauma. There are no associated symptoms like ear pain, fullness, drainage, popping, or crackling. She has not tried any treatments for the tinnitus and reports no recent changes in medication. She does not take aspirin, no neck pain and has no history of tobacco use. No barotrauma, ETD sx, no vestibular suppressant use. No CT imaging.  Her past medical history includes acid reflux and osteoporosis. She takes medication for acid reflux intermittently. She has not undergone any ear surgeries.  She experiences fair amount of PND in her throat, particularly in the morning, which she attributes to a dry throat or possibly reflux. Allergy nasal sprays have not provided significant relief. No CRS sx, h/o frequent sinusitis, allergy sx. Has not tried rinses.   She did have an audio today   ENT Surgery: no Personal or FHx of bleeding dz or anesthesia difficulty: no  AP/AC: no  Tobacco: no  PMHx: GERD, Osteoporosis, Anxiety, PreDM  Independent Review of Additional Tests or Records:  Dr. Alm Seabron Referral notes reviewed and uploaded or  available in chart in media tab (09/28/2023): noted tinnitus over past year, affecting at night time. Noted insomnia and situational anxiety; Dx: Tinnitus; Rx: ref to ENT 03/2024 Audiogram was independently reviewed and interpreted by me and it reveals - normal downsloping to mild SNHL; WRT 96%/100% AD/AS; A/A tymps, wax AS   SNHL= Sensorineural hearing loss   PMH/Meds/All/SocHx/FamHx/ROS:   Past Medical History:  Diagnosis Date   GERD (gastroesophageal reflux disease)    Osteopenia    Tremor      Past Surgical History:  Procedure Laterality Date   NO PAST SURGERIES      Family History  Problem Relation Age of Onset   Diabetes Father      Social Connections: Not on file      Current Outpatient Medications:    ibandronate (BONIVA) 150 MG tablet, Take 150 mg by mouth every 30 (thirty) days., Disp: , Rfl:    LORazepam (ATIVAN) 0.5 MG tablet, Take 0.5 mg by mouth every 8 (eight) hours., Disp: , Rfl:    Multiple Vitamin (MULTIVITAMIN) tablet, Take 1 tablet by mouth daily., Disp: , Rfl:    omeprazole (PRILOSEC) 20 MG capsule, Take 20 mg by mouth daily., Disp: , Rfl: 5   valACYclovir (VALTREX) 1000 MG tablet, Take 1,000 mg by mouth 3 (three) times daily., Disp: , Rfl:    Physical Exam:   BP 122/80 (BP Location: Right Arm, Patient Position: Sitting, Cuff Size: Large)   Pulse 69   Ht 5' 7 (1.702 m)   SpO2 96%   BMI 19.89 kg/m   Salient findings:  CN II-XII intact Bilateral EAC clear on  right, cerumen impaction left; after clearance, TM intact with well pneumatized middle ear spaces Weber 512: mid Rinne 512: AC > BC b/l  Anterior rhinoscopy: Septum intact; bilateral inferior turbinates without significant hypertrophy; mild mucoid secretions along floor of nose No lesions of oral cavity/oropharynx No obviously palpable neck masses/lymphadenopathy/thyromegaly No respiratory distress or stridor  Seprately Identifiable Procedures:  Prior to initiating any procedures,  risks/benefits/alternatives were explained to the patient and verbal consent obtained. Procedure: Bilateral ear microscopy and cerumen removal using microscope (CPT [id number]) - Mod 25 Pre-procedure diagnosis: Cerumen impaction left external ears Post-procedure diagnosis: same Indication: Left cerumen impaction; given patient's otologic complaints and history as well as for improved and comprehensive examination of external ear and tympanic membrane, bilateral otologic examination using microscope was performed and impacted cerumen removed  Procedure: Patient was placed semi-recumbent. Both ear canals were examined using the microscope with findings above. Impacted Cerumen removed on left using currette with improvement in EAC examination and patency. Patient tolerated the procedure well.      Impression & Plans:  [name] is a 63 y.o. female with:  1. Tinnitus of both ears   2. PND (post-nasal drip)   3. Sensorineural hearing loss, bilateral   4. Impacted cerumen of left ear    Tinnitus associated with high-frequency sensorineural hearing loss. Most likely due to HF SNHL. Discussed options: - Recommend masking devices like sound machines, especially at night. - Suggest over-the-counter flavonoids (Lipoflavonoid) trial. - Consider hearing aids if other measures fail - She agrees  Impacted cerumen Advised against cotton swabs to prevent deeper wax impaction - Advise using baby oil drops in ears a few times a week to keep wax moist and facilitate natural expulsion.  PND: could be several causes including GERD or allergies - Rec daily use of GERD medication x2 weeks. - Daily neti pot for nasal rinsing to help with postnasal symptoms. - if sx persist in 6-8 weeks, she will call back and can consider nasal endo at that time; she agrees  See below regarding exact medications prescribed this encounter including dosages and route: No orders of the defined types were placed in this  encounter.     Thank you for allowing me the opportunity to care for your patient. Please do not hesitate to contact me should you have any other questions.  Sincerely, Eldora Blanch, MD Otolaryngologist (ENT), [facility] Phone: [phone] Fax: [phone]  04/06/2024, 12:32 PM   I have personally spent 45 minutes involved in face-to-face and non-face-to-face activities for this patient on the day of the visit.  Professional time spent excludes any procedures performed but includes the following activities, in addition to those noted in the documentation: preparing to see the patient (review of outside documentation and results), performing a medically appropriate examination, counseling, addressing multiple problems, documenting in the electronic health record

## 2024-05-30 DIAGNOSIS — H524 Presbyopia: Secondary | ICD-10-CM | POA: Diagnosis not present

## 2024-06-20 ENCOUNTER — Other Ambulatory Visit
